# Patient Record
Sex: Female | Born: 1995 | Race: Black or African American | Hispanic: No | Marital: Single | Smoking: Never smoker
Health system: Southern US, Community
[De-identification: ages and names within clinical notes are randomized; demographics above are authoritative.]

---

## 2017-12-01 ENCOUNTER — Encounter (HOSPITAL_COMMUNITY): Payer: Self-pay

## 2017-12-01 ENCOUNTER — Emergency Department (HOSPITAL_COMMUNITY): Payer: No Typology Code available for payment source

## 2017-12-01 ENCOUNTER — Other Ambulatory Visit: Payer: Self-pay

## 2017-12-01 ENCOUNTER — Emergency Department (HOSPITAL_COMMUNITY)
Admission: EM | Admit: 2017-12-01 | Discharge: 2017-12-01 | Disposition: A | Discharge status: Home / Self Care | Payer: No Typology Code available for payment source | Attending: Physician Assistant | Admitting: Physician Assistant

## 2017-12-01 DIAGNOSIS — Y999 Unspecified external cause status: Secondary | ICD-10-CM | POA: Diagnosis not present

## 2017-12-01 DIAGNOSIS — Y929 Unspecified place or not applicable: Secondary | ICD-10-CM | POA: Diagnosis not present

## 2017-12-01 DIAGNOSIS — M542 Cervicalgia: Secondary | ICD-10-CM | POA: Insufficient documentation

## 2017-12-01 DIAGNOSIS — M25552 Pain in left hip: Secondary | ICD-10-CM | POA: Diagnosis not present

## 2017-12-01 DIAGNOSIS — Y939 Activity, unspecified: Secondary | ICD-10-CM | POA: Diagnosis not present

## 2017-12-01 DIAGNOSIS — Z041 Encounter for examination and observation following transport accident: Secondary | ICD-10-CM | POA: Diagnosis present

## 2017-12-01 LAB — POC URINE PREG, ED: PREG TEST UR: NEGATIVE

## 2017-12-01 MED ORDER — IBUPROFEN 800 MG PO TABS: 800.0000 mg | ORAL_TABLET | Freq: Three times a day (TID) | ORAL | 0 refills | Status: DC

## 2017-12-01 MED ORDER — ACETAMINOPHEN 325 MG PO TABS
650.0000 mg | ORAL_TABLET | Freq: Once | ORAL | Status: AC
  Administered 2017-12-01: 650 mg via ORAL
  Filled 2017-12-01: qty 2

## 2017-12-01 MED ORDER — CYCLOBENZAPRINE HCL 10 MG PO TABS: 10.0000 mg | ORAL_TABLET | Freq: Two times a day (BID) | ORAL | 0 refills | Status: DC | PRN

## 2017-12-01 MED ORDER — IBUPROFEN 800 MG PO TABS: 800.0000 mg | ORAL_TABLET | Freq: Once | ORAL | Status: DC
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
## 2017-12-01 NOTE — ED Notes (Signed)
Bed: WA05 Expected date:  Expected time:  Means of arrival:  Comments: 
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
## 2017-12-01 NOTE — Discharge Instructions (Signed)
Please return with any concerns. °

## 2018-01-01 ENCOUNTER — Other Ambulatory Visit: Payer: Self-pay

## 2018-01-01 ENCOUNTER — Ambulatory Visit (HOSPITAL_COMMUNITY)
Admission: EM | Admit: 2018-01-01 | Discharge: 2018-01-01 | Disposition: A | Discharge status: Home / Self Care | Payer: Medicaid Other | Attending: Family Medicine | Admitting: Family Medicine

## 2018-01-01 ENCOUNTER — Encounter (HOSPITAL_COMMUNITY): Payer: Self-pay

## 2018-01-01 DIAGNOSIS — Z3201 Encounter for pregnancy test, result positive: Secondary | ICD-10-CM | POA: Diagnosis not present

## 2018-01-01 LAB — POCT PREGNANCY, URINE: PREG TEST UR: POSITIVE — AB

## 2018-01-01 MED ORDER — PRENATAL VITAMINS 0.8 MG PO TABS: 1.0000 | ORAL_TABLET | Freq: Every day | ORAL | 2 refills | Status: AC

## 2018-01-01 NOTE — ED Notes (Signed)
Pt discharged by provider.

## 2018-01-01 NOTE — Discharge Instructions (Addendum)
Urine pregnancy positive Referral to OB/GYN for further evaluation and management of pregnancy Prescribed prenatal vitamins Return or go to ER if you experience any concerning symptoms

## 2018-01-01 NOTE — ED Provider Notes (Signed)
[facility]    CSN: [id number] Arrival date & time: 01/01/18  1816     History   Chief Complaint Chief Complaint  Patient presents with  . Possible Pregnancy    HPI Lynn Trujillo is a 22 y.o. female.   G1P1 Presents for possible pregnancy.  LMP was 12/29/17 that lasted one day.  LMP before that was 12/08/17.  Last unprotected sexual intercourse was two weeks.  Has not tried taking at home pregnancy test.  She does not have an OB/GYN.  Currently not taking prenatal vitamins.  Currently not on birth control.  Denies vaginal bleeding, vaginal pain, vaginal discharge, breast tenderness, weight gain, nausea, and vomiting.         History reviewed. No pertinent past medical history.  There are no active problems to display for this patient.   History reviewed. No pertinent surgical history.  OB History   None      Home Medications    Prior to Admission medications   Medication Sig Start Date End Date Taking? Authorizing Provider  Prenatal Multivit-Min-Fe-FA (PRENATAL VITAMINS) 0.8 MG tablet Take 1 tablet by mouth daily. 01/01/18   Rennis Harding, PA-C    Family History History reviewed. No pertinent family history.  Social History Social History   Tobacco Use  . Smoking status: Never Smoker  . Smokeless tobacco: Never Used  Substance Use Topics  . Alcohol use: Never    Frequency: Never  . Drug use: Never     Allergies   Patient has no known allergies.   Review of Systems Review of Systems  Constitutional: Negative for chills and fever.  Respiratory: Negative for shortness of breath.   Cardiovascular: Negative for chest pain.  Gastrointestinal: Negative for abdominal pain, constipation, diarrhea, nausea and vomiting.  Genitourinary: Negative for decreased urine volume, difficulty urinating, dysuria, pelvic pain, vaginal bleeding, vaginal discharge and vaginal pain.     Physical Exam Triage Vital Signs ED Triage Vitals  Enc Vitals Group    BP 01/01/18 1911 109/63     Pulse Rate 01/01/18 1911 89     Resp 01/01/18 1911 17     Temp 01/01/18 1911 98.2 F (36.8 C)     Temp Source 01/01/18 1911 Oral     SpO2 01/01/18 1911 98 %     Weight --      Height --      Head Circumference --      Peak Flow --      Pain Score 01/01/18 1912 0     Pain Loc --      Pain Edu? --      Excl. in [place]? --    No data found.  Updated Vital Signs BP 109/63 (BP Location: Left Arm)   Pulse 89   Temp 98.2 F (36.8 C) (Oral)   Resp 17   LMP 12/30/2017 (Exact Date)   SpO2 98%   Physical Exam  Constitutional: She is oriented to person, place, and time. She appears well-developed and well-nourished. No distress.  HENT:  Head: Normocephalic and atraumatic.  Right Ear: External ear normal.  Left Ear: External ear normal.  Nose: Nose normal.  Mouth/Throat: Oropharynx is clear and moist. No oropharyngeal exudate.  Eyes: Pupils are equal, round, and reactive to light. EOM are normal.  Neck: Normal range of motion. Neck supple.  Cardiovascular: Normal rate, regular rhythm and normal heart sounds. Exam reveals no gallop and no friction rub.  No murmur heard. Radial pulse 2+ bilaterally  Pulmonary/Chest: Effort normal and breath sounds normal. No stridor. No respiratory distress. She has no wheezes. She has no rales.  Abdominal: Soft. Bowel sounds are normal. There is no tenderness.  Musculoskeletal:  Ambulates from chair to exam table without difficulty  Lymphadenopathy:    She has no cervical adenopathy.  Neurological: She is alert and oriented to person, place, and time.  Skin: Skin is warm and dry. Capillary refill takes less than 2 seconds. She is not diaphoretic.  Psychiatric: She has a normal mood and affect. Her behavior is normal. Judgment and thought content normal.  Nursing note and vitals reviewed.    UC Treatments / Results  Labs (all labs ordered are listed, but only abnormal results are displayed) Labs Reviewed  POCT  PREGNANCY, URINE - Abnormal; Notable for the following components:      Result Value   Preg Test, Ur POSITIVE (*)    All other components within normal limits    EKG None  Radiology No results found.  Procedures Procedures (including critical care time)  Medications Ordered in UC Medications - No data to display  Initial Impression / Assessment and Plan / UC Course  I have reviewed the triage vital signs and the nursing notes.  Pertinent labs & imaging results that were available during my care of the patient were reviewed by me and considered in my medical decision making (see chart for details).     Patient presents today for possible pregnancy.  Currently asymptomatic.  UPT was positive.  Prescribed prenatal vitamins and referred to OB/GYN.  Return and ER precautions given.   Final Clinical Impressions(s) / UC Diagnoses   Final diagnoses:  Positive pregnancy test     Discharge Instructions     Urine pregnancy positive Referral to OB/GYN for further evaluation and management of pregnancy Prescribed prenatal vitamins Return or go to ER if you experience any concerning symptoms     ED Prescriptions    Medication Sig Dispense Auth. Provider   Prenatal Multivit-Min-Fe-FA (PRENATAL VITAMINS) 0.8 MG tablet Take 1 tablet by mouth daily. 30 tablet Alvino Chapel, [place], PA-C     Controlled Substance Prescriptions [place] Controlled Substance Registry consulted? Not Applicable   Rennis Harding, [place] 01/01/18 1956

## 2018-01-01 NOTE — ED Triage Notes (Signed)
Patient presents to [facility] for possible pregnancy, pt states she only had her period for 1 day and thinks she may be pregnant and wants a pregnancy test.

## 2018-01-06 ENCOUNTER — Emergency Department (HOSPITAL_COMMUNITY): Payer: Medicaid Other

## 2018-01-06 ENCOUNTER — Encounter (HOSPITAL_COMMUNITY): Payer: Self-pay | Admitting: Emergency Medicine

## 2018-01-06 ENCOUNTER — Emergency Department (HOSPITAL_COMMUNITY)
Admission: EM | Admit: 2018-01-06 | Discharge: 2018-01-06 | Disposition: A | Discharge status: Home / Self Care | Payer: Medicaid Other | Attending: Emergency Medicine | Admitting: Emergency Medicine

## 2018-01-06 ENCOUNTER — Other Ambulatory Visit: Payer: Self-pay

## 2018-01-06 DIAGNOSIS — Z3A00 Weeks of gestation of pregnancy not specified: Secondary | ICD-10-CM | POA: Insufficient documentation

## 2018-01-06 DIAGNOSIS — O26891 Other specified pregnancy related conditions, first trimester: Secondary | ICD-10-CM | POA: Diagnosis not present

## 2018-01-06 DIAGNOSIS — O9989 Other specified diseases and conditions complicating pregnancy, childbirth and the puerperium: Secondary | ICD-10-CM | POA: Diagnosis not present

## 2018-01-06 DIAGNOSIS — R1032 Left lower quadrant pain: Secondary | ICD-10-CM | POA: Diagnosis not present

## 2018-01-06 DIAGNOSIS — R1031 Right lower quadrant pain: Secondary | ICD-10-CM | POA: Diagnosis not present

## 2018-01-06 DIAGNOSIS — Z79899 Other long term (current) drug therapy: Secondary | ICD-10-CM | POA: Diagnosis not present

## 2018-01-06 DIAGNOSIS — R350 Frequency of micturition: Secondary | ICD-10-CM | POA: Diagnosis not present

## 2018-01-06 LAB — LIPASE, BLOOD: Lipase: 25 U/L (ref 11–51)

## 2018-01-06 LAB — COMPREHENSIVE METABOLIC PANEL
ALBUMIN: 3.5 g/dL (ref 3.5–5.0)
ALT: 14 U/L (ref 14–54)
ANION GAP: 8 (ref 5–15)
AST: 15 U/L (ref 15–41)
Alkaline Phosphatase: 78 U/L (ref 38–126)
BUN: 7 mg/dL (ref 6–20)
CO2: 25 mmol/L (ref 22–32)
Calcium: 8.8 mg/dL — ABNORMAL LOW (ref 8.9–10.3)
Chloride: 105 mmol/L (ref 101–111)
Creatinine, Ser: 0.74 mg/dL (ref 0.44–1.00)
GFR calc Af Amer: >60 mL/min (ref >60)
GFR calc non Af Amer: >60 mL/min (ref >60)
GLUCOSE: 103 mg/dL — AB (ref 65–99)
POTASSIUM: 3.3 mmol/L — AB (ref 3.5–5.1)
SODIUM: 138 mmol/L (ref 135–145)
Total Bilirubin: 0.3 mg/dL (ref 0.3–1.2)
Total Protein: 6.9 g/dL (ref 6.5–8.1)

## 2018-01-06 LAB — CBC
HEMATOCRIT: 34.1 % — AB (ref 36.0–46.0)
HEMOGLOBIN: 11.4 g/dL — AB (ref 12.0–15.0)
MCH: 28.6 pg (ref 26.0–34.0)
MCHC: 33.4 g/dL (ref 30.0–36.0)
MCV: 85.5 fL (ref 78.0–100.0)
Platelets: 143 10*3/uL — ABNORMAL LOW (ref 150–400)
RBC: 3.99 MIL/uL (ref 3.87–5.11)
RDW: 13 % (ref 11.5–15.5)
WBC: 4.1 10*3/uL (ref 4.0–10.5)

## 2018-01-06 LAB — I-STAT BETA HCG BLOOD, ED (MC, WL, AP ONLY): I-stat hCG, quantitative: >2000 m[IU]/mL — ABNORMAL HIGH (ref <5)

## 2018-01-06 LAB — HCG, QUANTITATIVE, PREGNANCY: hCG, Beta Chain, Quant, S: 8036 m[IU]/mL — ABNORMAL HIGH (ref <5)

## 2018-01-06 NOTE — ED Triage Notes (Signed)
Pt reports that she found out she was pregnant [redacted] week ago. C/o abdominal pain and discharge x 2 days. LMP April 10

## 2018-01-06 NOTE — ED Provider Notes (Signed)
Pt decided that she did not want to wait for the ultrasound.  Suspicion low overall for ectopic.  Outpatient follow up encouraged.  Medical screening examination/treatment/procedure(s) were conducted as a shared visit with non-physician practitioner(s) and myself.  I personally evaluated the patient during the encounter.     Linwood Dibbles, MD 01/06/18 [phone]

## 2018-01-06 NOTE — ED Provider Notes (Signed)
[facility] EMERGENCY DEPARTMENT Provider Note   CSN: [id number] Arrival date & time: 01/06/18  1502     History   Chief Complaint Chief Complaint  Patient presents with  . Abdominal Pain    HPI Lynn Trujillo is a 22 y.o. female G3 P1-0-1-1 with no pertinent past medical history presenting with lower abdominal cramping, urinary frequency and lower back pain which started today.  Reports spotting 1 week ago but has not had any bleeding since.  Denies any vaginal discharge, nausea, vomiting, fever, chills.  LMP 12/06/2017.   HPI  History reviewed. No pertinent past medical history.  There are no active problems to display for this patient.   History reviewed. No pertinent surgical history.   OB History    Gravida  1   Para      Term      Preterm      AB      Living        SAB      TAB      Ectopic      Multiple      Live Births                Home Medications    Prior to Admission medications   Medication Sig Start Date End Date Taking? Authorizing Provider  Prenatal Multivit-Min-Fe-FA (PRENATAL VITAMINS) 0.8 MG tablet Take 1 tablet by mouth daily. 01/01/18   [place], [place], PA-C    Family History No family history on file.  Social History Social History   Tobacco Use  . Smoking status: Never Smoker  . Smokeless tobacco: Never Used  Substance Use Topics  . Alcohol use: Never    Frequency: Never  . Drug use: Never     Allergies   Patient has no known allergies.   Review of Systems Review of Systems  Constitutional: Positive for fatigue. Negative for chills, diaphoresis and fever.  Respiratory: Negative for chest tightness, shortness of breath, wheezing and stridor.   Cardiovascular: Negative for chest pain.  Gastrointestinal: Negative for abdominal distention, blood in stool, nausea and vomiting.  Genitourinary: Positive for frequency and pelvic pain. Negative for difficulty urinating, dysuria, hematuria,  urgency, vaginal bleeding and vaginal discharge.  Musculoskeletal: Positive for back pain. Negative for myalgias, neck pain and neck stiffness.  Skin: Negative for color change, pallor and rash.  Neurological: Negative for dizziness, weakness, numbness and headaches.     Physical Exam Updated Vital Signs BP 110/61 (BP Location: Right Arm)   Pulse 75   Temp 98.4 F (36.9 C) (Oral)   Resp 20   Ht  (1.575 m)   LMP 12/06/2017   SpO2 99%   BMI 27.44 kg/m   Physical Exam  Constitutional: She appears well-developed and well-nourished.  Non-toxic appearance. She does not appear ill. No distress.  Well-appearing, nontoxic afebrile lying comfortably in bed no acute distress.  HENT:  Head: Normocephalic and atraumatic.  Eyes: Conjunctivae are normal.  Neck: Neck supple.  Cardiovascular: Normal rate.  Pulmonary/Chest: Effort normal. No stridor. No respiratory distress.  Abdominal: Soft. Normal appearance. There is no tenderness. There is no rigidity, no rebound, no guarding, no CVA tenderness and no tenderness at McBurney's point.  Genitourinary: Right adnexum displays tenderness. Left adnexum displays tenderness.  Genitourinary Comments: Mild discomfort with palpation of the adnexa bilaterally.  Musculoskeletal: She exhibits no edema.  Neurological: She is alert.  Skin: Skin is warm and dry. No rash noted. She is not diaphoretic.  No cyanosis or erythema. No pallor.  Psychiatric: She has a normal mood and affect.  Nursing note and vitals reviewed.    ED Treatments / Results  Labs (all labs ordered are listed, but only abnormal results are displayed) Labs Reviewed  COMPREHENSIVE METABOLIC PANEL - Abnormal; Notable for the following components:      Result Value   Potassium 3.3 (*)    Glucose, Bld 103 (*)    Calcium 8.8 (*)    All other components within normal limits  CBC - Abnormal; Notable for the following components:   Hemoglobin 11.4 (*)    HCT 34.1 (*)    Platelets  143 (*)    All other components within normal limits  HCG, QUANTITATIVE, PREGNANCY - Abnormal; Notable for the following components:   hCG, Beta Chain, Quant, S 8,036 (*)    All other components within normal limits  I-STAT BETA HCG BLOOD, ED ([facility], [facility], [facility] ONLY) - Abnormal; Notable for the following components:   I-stat hCG, quantitative >2,000.0 (*)    All other components within normal limits  LIPASE, BLOOD  URINALYSIS, ROUTINE W REFLEX MICROSCOPIC    EKG None  Radiology No results found.  Procedures Procedures (including critical care time)  Medications Ordered in ED Medications - No data to display   Initial Impression / Assessment and Plan / ED Course  I have reviewed the triage vital signs and the nursing notes.  Pertinent labs & imaging results that were available during my care of the patient were reviewed by me and considered in my medical decision making (see chart for details).    Patient presents with bilateral pelvic pain, urinary frequency and lower back ache for 1 day. Patient with recent positive pregnancy test one week ago. Spotting for one day a week ago, but no vaginal bleeding or discharge since. Denies fever or other symptoms. She reports otherwise feeling well.  hCG quantitative as expected based on LMP. Labs are otherwise unremarkable  Ordered ultrasound, although low suspicion for ectopic. Patient has mild cramping, no vaginal bleeding, no focal tenderness on exam, normal quant hcg.  She decided to leave prior to [place] and did not provide a urine. She was well-appearing, non-toxic, afebrile and without CVA tenderness. Patient's symptoms are consistent with normal pregnancy in first trimester. Low suspicion for serious etiology of patient's symptoms at this time.  Final Clinical Impressions(s) / ED Diagnoses   Final diagnoses:  Abdominal cramping, bilateral lower quadrant  Urinary frequency    ED Discharge Orders    None       Gregary Cromer 01/06/18 2017    Linwood Dibbles, MD 01/07/18 1239

## 2018-01-06 NOTE — ED Notes (Signed)
Pt notified RN that they wanted to leave before the scan was completed. Pt stated "we've been here since two and really just want to go". EDP (MD) notified of pt wishes. Pt and family left room soon after. RN awaiting further direction from EDP before closing chart.

## 2018-01-18 DIAGNOSIS — O30041 Twin pregnancy, dichorionic/diamniotic, first trimester: Secondary | ICD-10-CM | POA: Insufficient documentation

## 2018-01-18 DIAGNOSIS — Z3A01 Less than 8 weeks gestation of pregnancy: Secondary | ICD-10-CM | POA: Insufficient documentation

## 2018-01-18 DIAGNOSIS — O418X10 Other specified disorders of amniotic fluid and membranes, first trimester, not applicable or unspecified: Secondary | ICD-10-CM | POA: Diagnosis not present

## 2018-01-18 DIAGNOSIS — O26891 Other specified pregnancy related conditions, first trimester: Secondary | ICD-10-CM | POA: Insufficient documentation

## 2018-01-18 DIAGNOSIS — O219 Vomiting of pregnancy, unspecified: Secondary | ICD-10-CM | POA: Diagnosis not present

## 2018-01-18 NOTE — ED Triage Notes (Signed)
Pt states she is pregnant and has been vomiting for the past 4 days and is unable to keep anything down  Pt is c/o abd pain with back pain

## 2018-01-19 ENCOUNTER — Encounter (HOSPITAL_COMMUNITY): Payer: Self-pay | Admitting: Emergency Medicine

## 2018-01-19 ENCOUNTER — Other Ambulatory Visit: Payer: Self-pay

## 2018-01-19 ENCOUNTER — Emergency Department (HOSPITAL_COMMUNITY): Payer: Medicaid Other

## 2018-01-19 ENCOUNTER — Emergency Department (HOSPITAL_COMMUNITY)
Admission: EM | Admit: 2018-01-19 | Discharge: 2018-01-19 | Disposition: A | Discharge status: Home / Self Care | Payer: Medicaid Other | Attending: Emergency Medicine | Admitting: Emergency Medicine

## 2018-01-19 DIAGNOSIS — O26899 Other specified pregnancy related conditions, unspecified trimester: Secondary | ICD-10-CM

## 2018-01-19 DIAGNOSIS — O30041 Twin pregnancy, dichorionic/diamniotic, first trimester: Secondary | ICD-10-CM

## 2018-01-19 DIAGNOSIS — O219 Vomiting of pregnancy, unspecified: Secondary | ICD-10-CM

## 2018-01-19 DIAGNOSIS — R109 Unspecified abdominal pain: Secondary | ICD-10-CM

## 2018-01-19 LAB — COMPREHENSIVE METABOLIC PANEL
ALT: 17 U/L (ref 14–54)
AST: 17 U/L (ref 15–41)
Albumin: 3.6 g/dL (ref 3.5–5.0)
Alkaline Phosphatase: 77 U/L (ref 38–126)
Anion gap: 9 (ref 5–15)
BUN: 7 mg/dL (ref 6–20)
CALCIUM: 9.2 mg/dL (ref 8.9–10.3)
CO2: 22 mmol/L (ref 22–32)
Chloride: 104 mmol/L (ref 101–111)
Creatinine, Ser: 0.59 mg/dL (ref 0.44–1.00)
Glucose, Bld: 90 mg/dL (ref 65–99)
Potassium: 3.4 mmol/L — ABNORMAL LOW (ref 3.5–5.1)
SODIUM: 135 mmol/L (ref 135–145)
Total Bilirubin: 0.3 mg/dL (ref 0.3–1.2)
Total Protein: 7.6 g/dL (ref 6.5–8.1)

## 2018-01-19 LAB — CBC WITH DIFFERENTIAL/PLATELET
Basophils Absolute: 0 10*3/uL (ref 0.0–0.1)
Basophils Relative: 0 %
Eosinophils Absolute: 0.1 10*3/uL (ref 0.0–0.7)
Eosinophils Relative: 1 %
HCT: 35.4 % — ABNORMAL LOW (ref 36.0–46.0)
HEMOGLOBIN: 12 g/dL (ref 12.0–15.0)
LYMPHS ABS: 2.1 10*3/uL (ref 0.7–4.0)
LYMPHS PCT: 32 %
MCH: 28.6 pg (ref 26.0–34.0)
MCHC: 33.9 g/dL (ref 30.0–36.0)
MCV: 84.5 fL (ref 78.0–100.0)
Monocytes Absolute: 0.5 10*3/uL (ref 0.1–1.0)
Monocytes Relative: 8 %
NEUTROS PCT: 59 %
Neutro Abs: 4 10*3/uL (ref 1.7–7.7)
Platelets: 137 10*3/uL — ABNORMAL LOW (ref 150–400)
RBC: 4.19 MIL/uL (ref 3.87–5.11)
RDW: 12.9 % (ref 11.5–15.5)
WBC: 6.7 10*3/uL (ref 4.0–10.5)

## 2018-01-19 LAB — LIPASE, BLOOD: Lipase: 21 U/L (ref 11–51)

## 2018-01-19 LAB — HCG, QUANTITATIVE, PREGNANCY: HCG, BETA CHAIN, QUANT, S: 120995 m[IU]/mL — AB (ref <5)

## 2018-01-19 MED ORDER — DOXYLAMINE-PYRIDOXINE 10-10 MG PO TBEC: 1.0000 | DELAYED_RELEASE_TABLET | Freq: Three times a day (TID) | ORAL | 0 refills | Status: AC | PRN

## 2018-01-19 MED ORDER — ONDANSETRON HCL 4 MG/2ML IJ SOLN
4.0000 mg | Freq: Once | INTRAMUSCULAR | Status: AC
  Administered 2018-01-19: 4 mg via INTRAVENOUS
  Filled 2018-01-19: qty 2

## 2018-01-19 MED ORDER — LACTATED RINGERS IV BOLUS
1000.0000 mL | Freq: Once | INTRAVENOUS | Status: AC
  Administered 2018-01-19: 1000 mL via INTRAVENOUS

## 2018-01-19 NOTE — ED Provider Notes (Signed)
[facility]-EMERGENCY DEPT Provider Note   CSN: [id number] Arrival date & time: 01/18/18  2334     History   Chief Complaint Chief Complaint  Patient presents with  . Emesis    HPI Johnnette Laux is a 22 y.o. female.  The history is provided by the patient and medical records.  Emesis   Associated symptoms include abdominal pain (cramping).     22 y.o. F G2P1 approx [redacted] weeks gestation based on LMP (12/06/17), presenting to the ED with nausea/vomiting.  States for the past few days she not been able to hold anything down, not even water.  States she has a lot of lower abdominal cramping and back pain.  She denies any vaginal bleeding or discharge.  No pelvic pain.  No fever, chills.  She has not tried anything for her symptoms.  She has not yet seen OB-GYN.  No issues with prior pregnancies  History reviewed. No pertinent past medical history.  There are no active problems to display for this patient.   History reviewed. No pertinent surgical history.   OB History    Gravida  1   Para      Term      Preterm      AB      Living        SAB      TAB      Ectopic      Multiple      Live Births               Home Medications    Prior to Admission medications   Medication Sig Start Date End Date Taking? Authorizing Provider  Prenatal Multivit-Min-Fe-FA (PRENATAL VITAMINS) 0.8 MG tablet Take 1 tablet by mouth daily. Patient not taking: Reported on 01/19/2018 01/01/18   Rennis Harding, PA-C    Family History History reviewed. No pertinent family history.  Social History Social History   Tobacco Use  . Smoking status: Never Smoker  . Smokeless tobacco: Never Used  Substance Use Topics  . Alcohol use: Never    Frequency: Never  . Drug use: Never     Allergies   Patient has no known allergies.   Review of Systems Review of Systems  Gastrointestinal: Positive for abdominal pain (cramping), nausea and vomiting.  All other  systems reviewed and are negative.    Physical Exam Updated Vital Signs BP (!) 111/53 (BP Location: Right Arm)   Pulse 73   Temp 98.4 F (36.9 C) (Oral)   Resp 18   Ht  (1.6 m)   LMP 12/06/2017   SpO2 99%   BMI 26.57 kg/m   Physical Exam  Constitutional: She is oriented to person, place, and time. She appears well-developed and well-nourished.  HENT:  Head: Normocephalic and atraumatic.  Mouth/Throat: Oropharynx is clear and moist.  Eyes: Pupils are equal, round, and reactive to light. Conjunctivae and EOM are normal.  Neck: Normal range of motion.  Cardiovascular: Normal rate, regular rhythm and normal heart sounds.  Pulmonary/Chest: Effort normal and breath sounds normal. No stridor. No respiratory distress.  Abdominal: Soft. Bowel sounds are normal. There is no tenderness. There is no rebound.  Musculoskeletal: Normal range of motion.  Neurological: She is alert and oriented to person, place, and time.  Skin: Skin is warm and dry.  Psychiatric: She has a normal mood and affect.  Nursing note and vitals reviewed.    ED Treatments / Results  Labs (all labs  ordered are listed, but only abnormal results are displayed) Labs Reviewed  CBC WITH DIFFERENTIAL/PLATELET - Abnormal; Notable for the following components:      Result Value   HCT 35.4 (*)    Platelets 137 (*)    All other components within normal limits  COMPREHENSIVE METABOLIC PANEL - Abnormal; Notable for the following components:   Potassium 3.4 (*)    All other components within normal limits  HCG, QUANTITATIVE, PREGNANCY - Abnormal; Notable for the following components:   hCG, Beta Chain, Quant, S 120,995 (*)    All other components within normal limits  LIPASE, BLOOD  URINALYSIS, ROUTINE W REFLEX MICROSCOPIC    EKG None  Radiology US Ob Comp Less 14 Wks  Result Date: 01/19/2018 CLINICAL DATA:  Pregnant patient in first-trimester pregnancy with pelvic pain and cramping. EXAM: TWIN OBSTETRIC  <14WK [place] AND TRANSVAGINAL OB US COMPARISON:  None. FINDINGS: Number of IUPs:  2 Chorionicity/Amnionicity:  Dichorionic diamniotic, thick membrane. TWIN 1 Yolk sac:  Visualized. Embryo:  Visualized. Cardiac Activity: Visualized. Heart Rate: 135 bpm CRL:  5.3 mm   6 w 2 d                  [place] EDC: 09/12/2018 TWIN 2 Yolk sac:  Visualized. Embryo:  Visualized. Cardiac Activity: Visualized. Heart Rate: 126 bpm CRL:  5.6 mm   6 w 2 d                  [place] EDC: 09/12/2018 Subchorionic hemorrhage: Moderate noted both proximally and distally to the gestational sacs. Maternal uterus/adnexae: Right ovary is normal measuring 2.3 x 1 5 x 1.9 cm. Left ovary is normal measuring 3.1 x 2.6 x 2.7 cm and contains a 2 cm corpus luteal cyst. No pelvic free fluid. IMPRESSION: 1. Live dichorionic diamniotic twin intrauterine pregnancy. Estimated gestational age of both twins 6 weeks 2 days based on crown-rump length for [facility] 09/12/2018. 2. Moderate subchorionic hemorrhage. Electronically Signed   By: Rubye Oaks M.D.   On: 01/19/2018 03:53   US Ob Comp Addl Gest Less 14 Wks  Result Date: 01/19/2018 CLINICAL DATA:  Pregnant patient in first-trimester pregnancy with pelvic pain and cramping. EXAM: TWIN OBSTETRIC <14WK [place] AND TRANSVAGINAL OB US COMPARISON:  None. FINDINGS: Number of IUPs:  2 Chorionicity/Amnionicity:  Dichorionic diamniotic, thick membrane. TWIN 1 Yolk sac:  Visualized. Embryo:  Visualized. Cardiac Activity: Visualized. Heart Rate: 135 bpm CRL:  5.3 mm   6 w 2 d                  [place] EDC: 09/12/2018 TWIN 2 Yolk sac:  Visualized. Embryo:  Visualized. Cardiac Activity: Visualized. Heart Rate: 126 bpm CRL:  5.6 mm   6 w 2 d                  [place] EDC: 09/12/2018 Subchorionic hemorrhage: Moderate noted both proximally and distally to the gestational sacs. Maternal uterus/adnexae: Right ovary is normal measuring 2.3 x 1 5 x 1.9 cm. Left ovary is normal measuring 3.1 x 2.6 x 2.7 cm and contains a 2 cm corpus luteal cyst. No pelvic  free fluid. IMPRESSION: 1. Live dichorionic diamniotic twin intrauterine pregnancy. Estimated gestational age of both twins 6 weeks 2 days based on crown-rump length for [facility] 09/12/2018. 2. Moderate subchorionic hemorrhage. Electronically Signed   By: Rubye Oaks M.D.   On: 01/19/2018 03:53   US Ob Transvaginal  Result Date: 01/19/2018 CLINICAL DATA:  Pregnant  patient in first-trimester pregnancy with pelvic pain and cramping. EXAM: TWIN OBSTETRIC <14WK [place] AND TRANSVAGINAL OB US COMPARISON:  None. FINDINGS: Number of IUPs:  2 Chorionicity/Amnionicity:  Dichorionic diamniotic, thick membrane. TWIN 1 Yolk sac:  Visualized. Embryo:  Visualized. Cardiac Activity: Visualized. Heart Rate: 135 bpm CRL:  5.3 mm   6 w 2 d                  [place] EDC: 09/12/2018 TWIN 2 Yolk sac:  Visualized. Embryo:  Visualized. Cardiac Activity: Visualized. Heart Rate: 126 bpm CRL:  5.6 mm   6 w 2 d                  [place] EDC: 09/12/2018 Subchorionic hemorrhage: Moderate noted both proximally and distally to the gestational sacs. Maternal uterus/adnexae: Right ovary is normal measuring 2.3 x 1 5 x 1.9 cm. Left ovary is normal measuring 3.1 x 2.6 x 2.7 cm and contains a 2 cm corpus luteal cyst. No pelvic free fluid. IMPRESSION: 1. Live dichorionic diamniotic twin intrauterine pregnancy. Estimated gestational age of both twins 6 weeks 2 days based on crown-rump length for [facility] 09/12/2018. 2. Moderate subchorionic hemorrhage. Electronically Signed   By: Rubye Oaks M.D.   On: 01/19/2018 03:53    Procedures Procedures (including critical care time)  Medications Ordered in ED Medications  lactated ringers bolus 1,000 mL (0 mLs Intravenous Stopped 01/19/18 0230)  ondansetron (ZOFRAN) injection 4 mg (4 mg Intravenous Given 01/19/18 0141)     Initial Impression / Assessment and Plan / ED Course  I have reviewed the triage vital signs and the nursing notes.  Pertinent labs & imaging results that were available during my care of the  patient were reviewed by me and considered in my medical decision making (see chart for details).  22 y.o. F here with N/V.  Currently pregnant, approx 7 weeks based on LMP.  Reports some abdominal cramping.  Has not yet had [place] to confirm IUP.  Patient is afebrile, non-toxic.  Abdomen soft, non-tender.  Plan for labs, IVF, zofran.  Will get [place] to confirm IUP as well.    Patient's labs overall reassuring.  hcg increasing as expected.  US revealing dichorionic, diamniotic twins approx [redacted] weeks gestation.  There is subchorionic hemorrhage noted.  Remains without vaginal bleeding or discharge.  Feeling much better after IVF.  Tolerated several cups of water and sprite here without issue.  Will refer to women's clinic for ongoing follow-up.  Continue prenatal vitamins, diclegis for PRN use.  She understands to return here for any new/acute changes.  Final Clinical Impressions(s) / ED Diagnoses   Final diagnoses:  Nausea and vomiting in pregnancy  Dichorionic diamniotic twin pregnancy in first trimester    ED Discharge Orders        Ordered    Doxylamine-Pyridoxine (DICLEGIS) 10-10 MG TBEC  3 times daily PRN     01/19/18 0411       Garlon Hatchet, PA-C 01/19/18 1610    Geoffery Lyons, MD 01/19/18 [phone]

## 2018-01-19 NOTE — ED Notes (Signed)
Asked pt for urine specimen and informed pt that was the only pending order- pt denies having to urinate but will notify staff when able to do so.

## 2018-01-19 NOTE — Discharge Instructions (Signed)
Take the prescribed medication as directed.  Make sure to keep taking your prenatal vitamins. Follow-up with OB-GYN-- call clinic to make appt. Return to the ED for new or worsening symptoms.

## 2018-02-21 ENCOUNTER — Encounter: Payer: Medicaid Other | Admitting: Certified Nurse Midwife

## 2018-07-10 IMAGING — US US OB EACH ADDL GEST<[ID]
1 series · 14 of 28 positions shown · non-contrast
Comparison: None.

CLINICAL DATA: Pregnant patient in first-trimester pregnancy with
pelvic pain and cramping.

EXAM:
TWIN OBSTETRIC <14WK US AND TRANSVAGINAL OB US

[Series 1: us ob each addl gest<(id) · 14 of 72 slices shown]
[im 3/72]
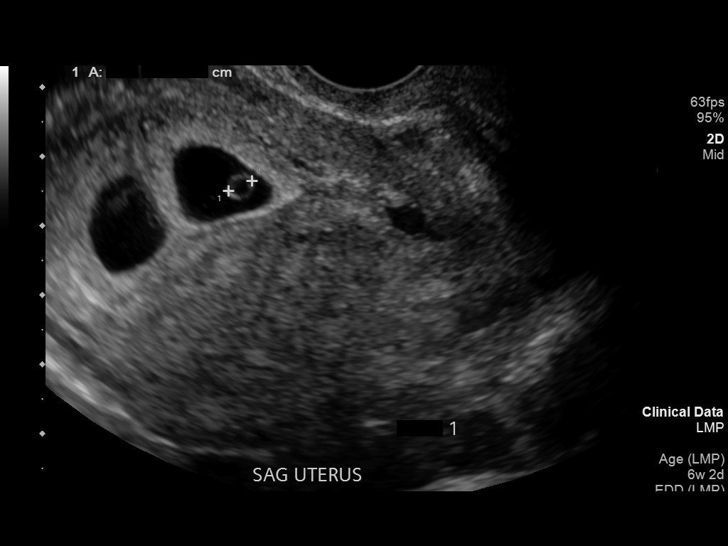
[im 8/72]
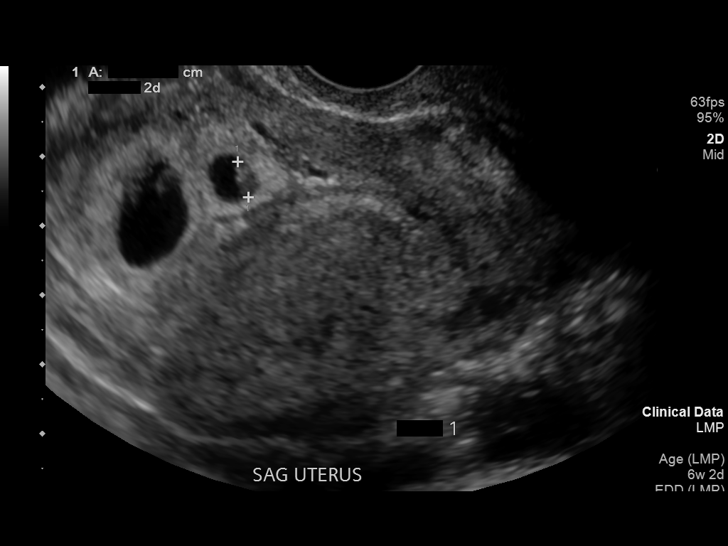
[im 14/72]
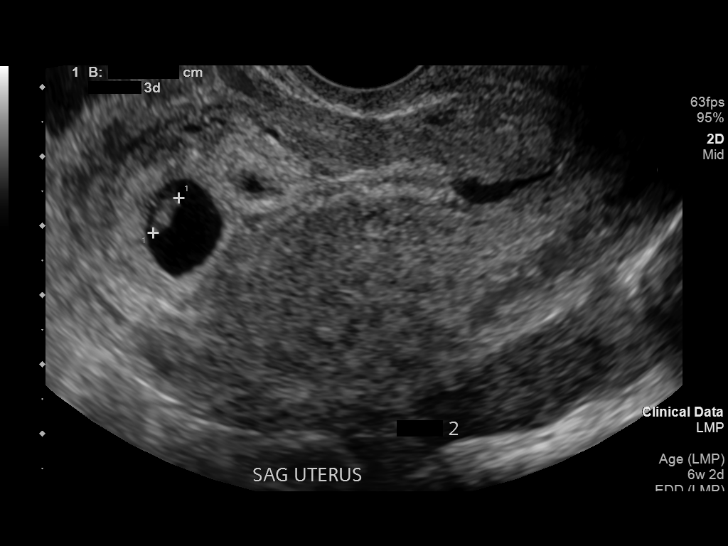
[im 19/72]
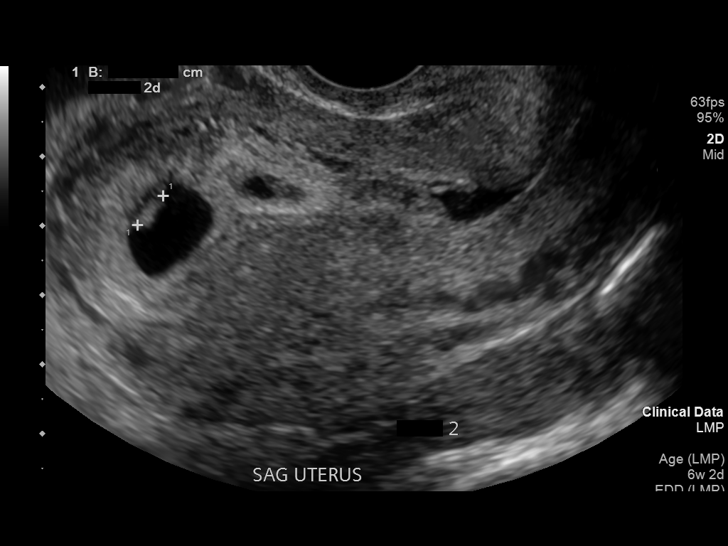
[im 24/72]
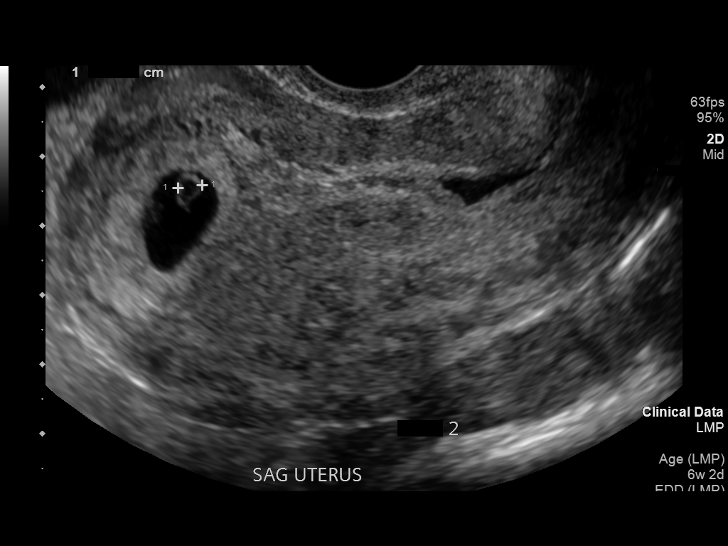
[im 29/72]
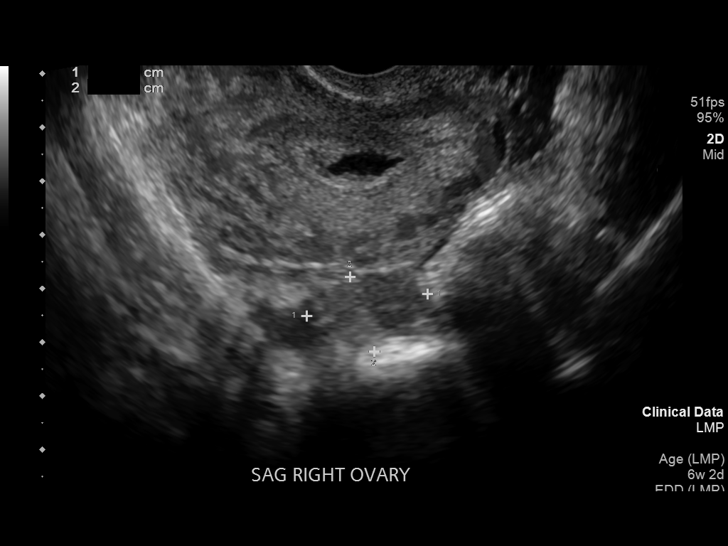
[im 35/72]
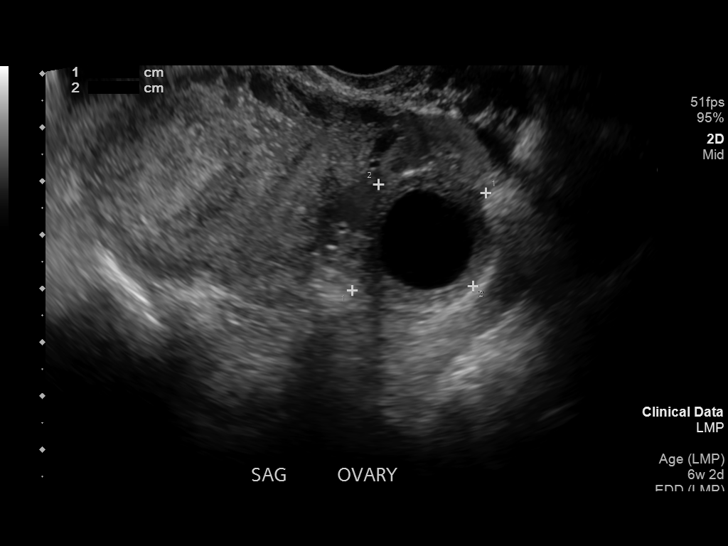
[im 40/72]
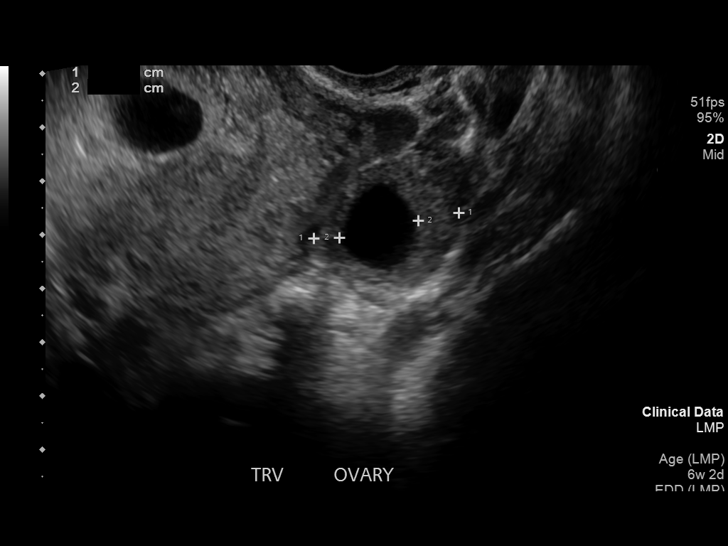
[im 45/72]
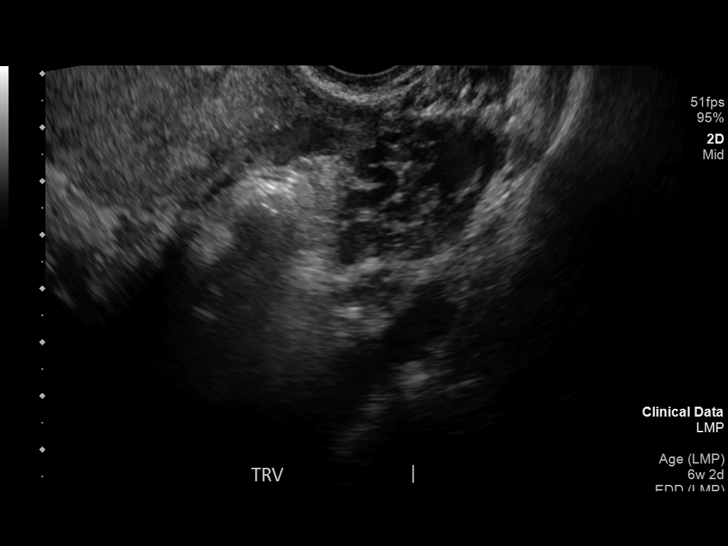
[im 50/72]
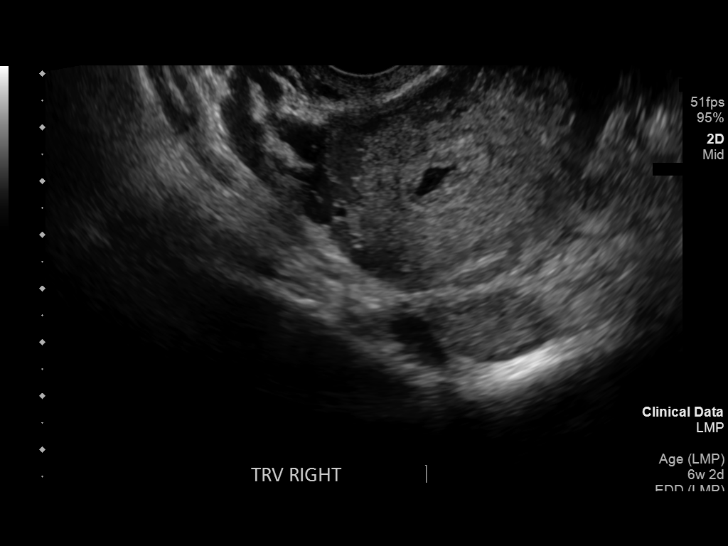
[im 56/72]
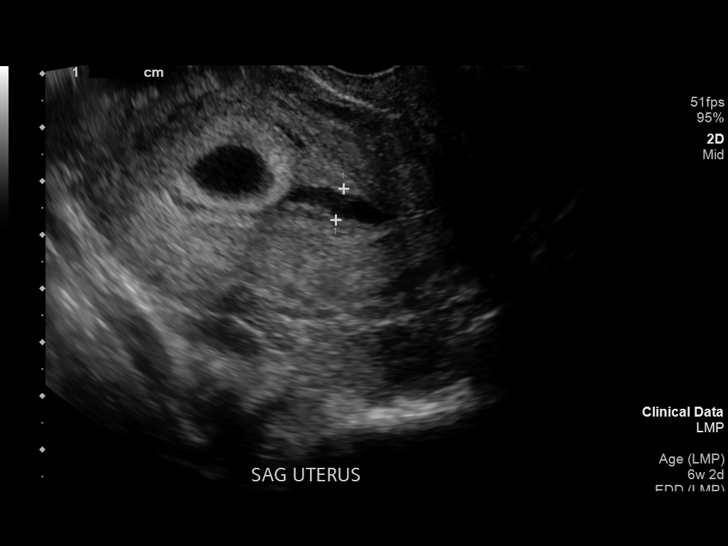
[im 61/72]
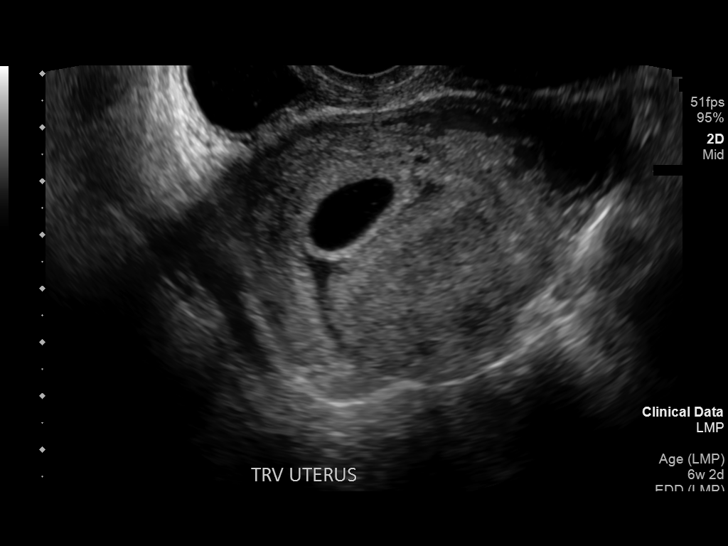
[im 66/72]
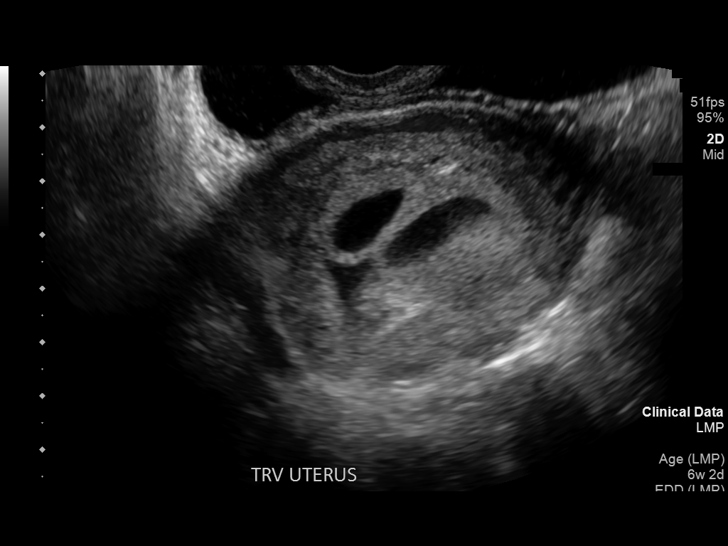
[im 72/72]
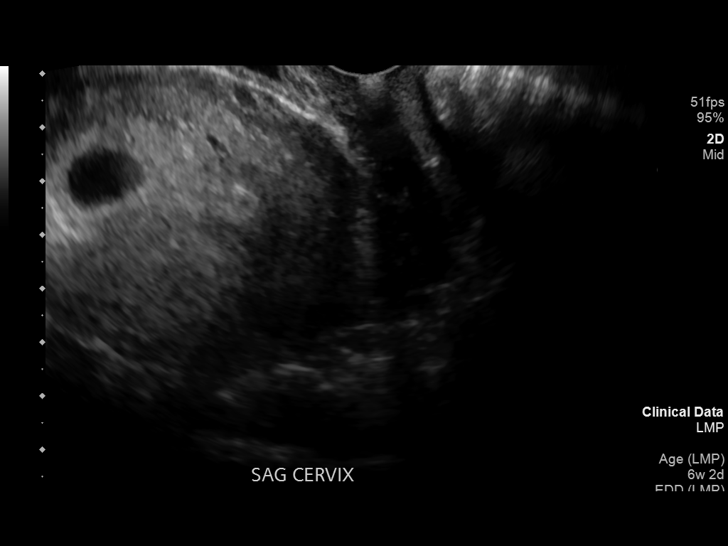

[14 of 28 positions shown; findings below may reference images not displayed]

FINDINGS: Number of IUPs:  2

Chorionicity/Amnionicity:  Dichorionic diamniotic, thick membrane.

TWIN 1

Yolk sac:  Visualized.

Embryo:  Visualized.

Cardiac Activity: Visualized.

Heart Rate: 135 bpm

CRL:  5.3 mm   6 w 2 d                  US EDC: 09/12/2018

TWIN 2

Yolk sac:  Visualized.

Embryo:  Visualized.

Cardiac Activity: Visualized.

Heart Rate: 126 bpm

CRL:  5.6 mm   6 w 2 d                  US EDC: 09/12/2018

Subchorionic hemorrhage: Moderate noted both proximally and distally
to the gestational sacs.

Maternal uterus/adnexae: Right ovary is normal measuring 2.3 x 1 5 x
1.9 cm. Left ovary is normal measuring 3.1 x 2.6 x 2.7 cm and
contains a 2 cm corpus luteal cyst. No pelvic free fluid.
IMPRESSION: 1. Live dichorionic diamniotic twin intrauterine pregnancy.
Estimated gestational age of both twins 6 weeks 2 days based on
crown-rump length for EDC 09/12/2018.
2. Moderate subchorionic hemorrhage.
